# Patient Record
Sex: Female | Born: 1995 | Race: Black or African American | Hispanic: No | Marital: Single | Smoking: Never smoker
Health system: Southern US, Community
[De-identification: ages and names within clinical notes are randomized; demographics above are authoritative.]

---
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
## 2018-02-18 ENCOUNTER — Encounter: Payer: Self-pay | Admitting: Emergency Medicine

## 2018-02-18 ENCOUNTER — Emergency Department (HOSPITAL_BASED_OUTPATIENT_CLINIC_OR_DEPARTMENT_OTHER)
Admission: EM | Admit: 2018-02-18 | Discharge: 2018-02-18 | Disposition: A | Discharge status: Home / Self Care | Payer: Worker's Compensation | Attending: Emergency Medicine | Admitting: Emergency Medicine

## 2018-02-18 DIAGNOSIS — S61412A Laceration without foreign body of left hand, initial encounter: Secondary | ICD-10-CM | POA: Diagnosis not present

## 2018-02-18 DIAGNOSIS — W25XXXA Contact with sharp glass, initial encounter: Secondary | ICD-10-CM | POA: Insufficient documentation

## 2018-02-18 DIAGNOSIS — S6992XA Unspecified injury of left wrist, hand and finger(s), initial encounter: Secondary | ICD-10-CM | POA: Diagnosis present

## 2018-02-18 DIAGNOSIS — Y990 Civilian activity done for income or pay: Secondary | ICD-10-CM | POA: Diagnosis not present

## 2018-02-18 DIAGNOSIS — Y9289 Other specified places as the place of occurrence of the external cause: Secondary | ICD-10-CM | POA: Insufficient documentation

## 2018-02-18 DIAGNOSIS — Y9389 Activity, other specified: Secondary | ICD-10-CM | POA: Diagnosis not present

## 2018-02-18 MED ORDER — LIDOCAINE-EPINEPHRINE-TETRACAINE (LET) SOLUTION
NASAL | Status: AC
  Administered 2018-02-18: 16:00:00 3 mL via TOPICAL
  Filled 2018-02-18: qty 3

## 2018-02-18 MED ORDER — LIDOCAINE-EPINEPHRINE-TETRACAINE (LET) SOLUTION
3.0000 mL | Freq: Once | NASAL | Status: AC
  Administered 2018-02-18: 3 mL via TOPICAL
  Filled 2018-02-18: qty 3

## 2018-02-18 NOTE — ED Provider Notes (Signed)
[facility] EMERGENCY DEPARTMENT Provider Note   CSN: [id number] Arrival date & time: 02/18/18  1519     History   Chief Complaint Chief Complaint  Patient presents with  . Laceration    HPI Judith Moreno is a 22 y.o. female.  Pt is a healthy 22 y/o female with no medical problems who was moving glasses today at work and she was trying to catch one that was falling and hit broke and hit the top of her left hand.  She denies any weakness and states the 2nd finger of the left hand is a little tingly.  No complaints with the thumb.  There was no glass in the wound or that had to be removed from the wound.  The history is provided by the patient.  Laceration   The incident occurred 3 to 5 hours ago. The laceration is located on the left hand. The laceration is 2 cm in size. The laceration mechanism was a broken glass. The pain is at a severity of 4/10. The pain is moderate. The pain has been constant since onset. She reports no foreign bodies present. Her tetanus status is UTD.    History reviewed. No pertinent past medical history.  There are no active problems to display for this patient.   History reviewed. No pertinent surgical history.   OB History   None      Home Medications    Prior to Admission medications   Not on File    Family History No family history on file.  Social History Social History   Tobacco Use  . Smoking status: Never Smoker  . Smokeless tobacco: Never Used  Substance Use Topics  . Alcohol use: Yes  . Drug use: Not Currently     Allergies   Patient has no known allergies.   Review of Systems Review of Systems  All other systems reviewed and are negative.    Physical Exam Updated Vital Signs BP (!) 141/75 (BP Location: Left Arm)   Pulse (!) 108   Temp 98 F (36.7 C) (Oral)   Resp 20   LMP 02/08/2018   SpO2 99%   Physical Exam  Constitutional: She is oriented to person, place, and time. She appears  well-developed and well-nourished. No distress.  HENT:  Head: Normocephalic and atraumatic.  Eyes: Pupils are equal, round, and reactive to light. EOM are normal.  Cardiovascular: Normal rate.  Pulmonary/Chest: Effort normal.  Musculoskeletal:       Hands: Neurological: She is alert and oriented to person, place, and time.  Skin: Skin is warm and dry.  Psychiatric: She has a normal mood and affect. Her behavior is normal.  Nursing note and vitals reviewed.    ED Treatments / Results  Labs (all labs ordered are listed, but only abnormal results are displayed) Labs Reviewed - No data to display  EKG None  Radiology No results found.  Procedures Procedures (including critical care time)  LACERATION REPAIR Performed by: [employer] Wilhelmine Krogstad Authorized by: Gwyneth SproutWhitney Patrik Turnbaugh Consent: Verbal consent obtained. Risks and benefits: risks, benefits and alternatives were discussed Consent given by: patient Patient identity confirmed: provided demographic data Prepped and Draped in normal sterile fashion Wound explored  Laceration Location: left 2nd MCP joint  Laceration Length: 2cm  No Foreign Bodies seen or palpated  Anesthesia: LET Anesthetic total: 2 ml  Irrigation method: syringe Amount of cleaning: standard  Skin closure: 6.0 Vicryl  Number of sutures: 5  Technique: Simple interrupted  Patient tolerance:  Patient tolerated the procedure well with no immediate complications.   Medications Ordered in ED Medications  lidocaine-EPINEPHrine-tetracaine (LET) solution (has no administration in time range)     Initial Impression / Assessment and Plan / ED Course  I have reviewed the triage vital signs and the nursing notes.  Pertinent labs & imaging results that were available during my care of the patient were reviewed by me and considered in my medical decision making (see chart for details).     Pt with uncomplicated laceration of the left hand who is  Neurologically intact and no signs of tendon injury.  Wound repair as above and tetanus UTD.  Final Clinical Impressions(s) / ED Diagnoses   Final diagnoses:  Laceration of left hand without foreign body, initial encounter    ED Discharge Orders    None       Gwyneth Sprout, MD 02/18/18 [phone]

## 2018-02-18 NOTE — ED Triage Notes (Signed)
Pt states at work and cut the top of her lt hand with a broken glass

## 2018-07-27 ENCOUNTER — Encounter (HOSPITAL_BASED_OUTPATIENT_CLINIC_OR_DEPARTMENT_OTHER): Payer: Self-pay

## 2018-07-27 ENCOUNTER — Other Ambulatory Visit: Payer: Self-pay

## 2018-07-27 DIAGNOSIS — L245 Irritant contact dermatitis due to other chemical products: Secondary | ICD-10-CM | POA: Insufficient documentation

## 2018-07-27 DIAGNOSIS — J069 Acute upper respiratory infection, unspecified: Secondary | ICD-10-CM | POA: Insufficient documentation

## 2018-07-27 NOTE — ED Triage Notes (Signed)
Pt had extensions put in her hair a week ago, started having itching, burning and bumps to scalp on Friday that have gotten worse since then, pt has tried washing out her hair with shampoo and baby oil and it is starting to fall out

## 2018-07-28 ENCOUNTER — Emergency Department (HOSPITAL_BASED_OUTPATIENT_CLINIC_OR_DEPARTMENT_OTHER)
Admission: EM | Admit: 2018-07-28 | Discharge: 2018-07-28 | Disposition: A | Discharge status: Home / Self Care | Payer: Self-pay | Attending: Emergency Medicine | Admitting: Emergency Medicine

## 2018-07-28 DIAGNOSIS — L245 Irritant contact dermatitis due to other chemical products: Secondary | ICD-10-CM

## 2018-07-28 DIAGNOSIS — J069 Acute upper respiratory infection, unspecified: Secondary | ICD-10-CM

## 2018-07-28 MED ORDER — TRAMADOL HCL 50 MG PO TABS: 50.0000 mg | ORAL_TABLET | Freq: Four times a day (QID) | ORAL | 0 refills | Status: DC | PRN

## 2018-07-28 MED ORDER — PREDNISONE 50 MG PO TABS
60.0000 mg | ORAL_TABLET | Freq: Once | ORAL | Status: AC
  Administered 2018-07-28: 60 mg via ORAL
  Filled 2018-07-28: qty 1

## 2018-07-28 MED ORDER — TRAMADOL HCL 50 MG PO TABS
50.0000 mg | ORAL_TABLET | Freq: Once | ORAL | Status: AC
  Administered 2018-07-28: 50 mg via ORAL
  Filled 2018-07-28: qty 1

## 2018-07-28 MED ORDER — PREDNISONE 20 MG PO TABS: 60.0000 mg | ORAL_TABLET | Freq: Every day | ORAL | 0 refills | Status: DC

## 2018-07-28 NOTE — ED Notes (Signed)
Pt verbalizes understanding of d/c instructions and denies any further needs at this time. 

## 2018-07-28 NOTE — Discharge Instructions (Addendum)
Take acetaminophen, ibuprofen, or naproxen as needed for pain. Take tramadol for pain not relieved by the other medications.  Use Afrin nasal spray for your stuffy nose, but do not use it for more than three days, or it will make the congestion worse.  You may take any cold medication with DM in the name (contains dextromethorphan) as needed for your cough.  Do not smoke!

## 2018-07-28 NOTE — ED Provider Notes (Signed)
[facility] EMERGENCY DEPARTMENT Provider Note   CSN: [id number] Arrival date & time: 07/27/18  2337     History   Chief Complaint Chief Complaint  Patient presents with  . Hair/Scalp Problem    HPI Judith Moreno is a 22 y.o. female.  The history is provided by the patient.  She is complaining of a burning pain in her scalp.  One week ago, she had extensions put in her hair.  Scalp started burning 2 days later and she removed the extensions.  She has noted that her hair is falling out in some places.  There is no itching, but scalp has burning pain.  Of note, another person had extensions put in their hair at the same location and also developed similar problems.  As a second complaint, she has cold symptoms for the last 24 hours.  She has clear rhinorrhea and a slight cough which is nonproductive.  She denies sore throat.  She denies fever or chills.  She denies arthralgias or myalgias.  There have been no known sick contacts.  She does smoke occasionally.  History reviewed. No pertinent past medical history.  There are no active problems to display for this patient.   History reviewed. No pertinent surgical history.   OB History   None      Home Medications    Prior to Admission medications   Medication Sig Start Date End Date Taking? Authorizing Provider  predniSONE (DELTASONE) 20 MG tablet Take 3 tablets (60 mg total) by mouth daily. 07/28/18   Dione Booze, MD  traMADol (ULTRAM) 50 MG tablet Take 1 tablet (50 mg total) by mouth every 6 (six) hours as needed. 07/28/18   Dione Booze, MD    Family History No family history on file.  Social History Social History   Tobacco Use  . Smoking status: Never Smoker  . Smokeless tobacco: Never Used  Substance Use Topics  . Alcohol use: Yes  . Drug use: Not Currently     Allergies   Patient has no known allergies.   Review of Systems Review of Systems  All other systems reviewed and are  negative.    Physical Exam Updated Vital Signs BP 110/62 (BP Location: Left Arm)   Pulse 95   Temp 98.6 F (37 C) (Oral)   Resp 18   Ht 5\' 6"  (1.676 m)   Wt 78.9 kg   LMP 07/16/2018   SpO2 100%   BMI 28.08 kg/m   Physical Exam  Nursing note and vitals reviewed.  22 year old female, resting comfortably and in no acute distress. Vital signs are normal. Oxygen saturation is 100%, which is normal. Head is normocephalic and atraumatic. PERRLA, EOMI. Oropharynx is clear.  Scalp has areas of erythema with some vesicles present-mainly in the occipital region.  This is consistent with contact dermatitis.  There is no sinus tenderness. Neck is nontender and supple with shoddy posterior cervical adenopathy. Back is nontender and there is no CVA tenderness. Lungs are clear without rales, wheezes, or rhonchi. Chest is nontender. Heart has regular rate and rhythm without murmur. Abdomen is soft, flat, nontender without masses or hepatosplenomegaly and peristalsis is normoactive. Extremities have no cyanosis or edema, full range of motion is present. Skin is warm and dry without rash. Neurologic: Mental status is normal, cranial nerves are intact, there are no motor or sensory deficits.  ED Treatments / Results   Procedures Procedures  Medications Ordered in ED Medications  traMADol (ULTRAM) tablet  50 mg (50 mg Oral Given 07/28/18 0317)  predniSONE (DELTASONE) tablet 60 mg (60 mg Oral Given 07/28/18 0317)     Initial Impression / Assessment and Plan / ED Course  I have reviewed the triage vital signs and the nursing notes.  Contact dermatitis of the scalp.  Upper respiratory tract infection.  She is discharged with prescriptions for prednisone and tramadol, advised to use over-the-counter metolazone for nasal congestion, over-the-counter dextromethorphan for cough.  Advised to avoid all smoking.  Return precautions discussed.  Final Clinical Impressions(s) / ED Diagnoses   Final  diagnoses:  Irritant contact dermatitis due to other chemical products  Viral URI    ED Discharge Orders         Ordered    predniSONE (DELTASONE) 20 MG tablet  Daily     07/28/18 0322    traMADol (ULTRAM) 50 MG tablet  Every 6 hours PRN     07/28/18 0322           Dione Booze, MD 07/28/18 [phone]

## 2019-12-08 ENCOUNTER — Emergency Department (HOSPITAL_BASED_OUTPATIENT_CLINIC_OR_DEPARTMENT_OTHER)
Admission: EM | Admit: 2019-12-08 | Discharge: 2019-12-08 | Disposition: A | Discharge status: Home / Self Care | Payer: Self-pay | Attending: Emergency Medicine | Admitting: Emergency Medicine

## 2019-12-08 ENCOUNTER — Other Ambulatory Visit: Payer: Self-pay

## 2019-12-08 ENCOUNTER — Encounter (HOSPITAL_BASED_OUTPATIENT_CLINIC_OR_DEPARTMENT_OTHER): Payer: Self-pay

## 2019-12-08 DIAGNOSIS — S46912A Strain of unspecified muscle, fascia and tendon at shoulder and upper arm level, left arm, initial encounter: Secondary | ICD-10-CM | POA: Diagnosis not present

## 2019-12-08 DIAGNOSIS — Y9389 Activity, other specified: Secondary | ICD-10-CM | POA: Insufficient documentation

## 2019-12-08 DIAGNOSIS — Y999 Unspecified external cause status: Secondary | ICD-10-CM | POA: Insufficient documentation

## 2019-12-08 DIAGNOSIS — S29012A Strain of muscle and tendon of back wall of thorax, initial encounter: Secondary | ICD-10-CM | POA: Insufficient documentation

## 2019-12-08 DIAGNOSIS — T148XXA Other injury of unspecified body region, initial encounter: Secondary | ICD-10-CM

## 2019-12-08 DIAGNOSIS — Y92410 Unspecified street and highway as the place of occurrence of the external cause: Secondary | ICD-10-CM | POA: Diagnosis not present

## 2019-12-08 DIAGNOSIS — S299XXA Unspecified injury of thorax, initial encounter: Secondary | ICD-10-CM | POA: Diagnosis present
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
## 2019-12-12 ENCOUNTER — Encounter (HOSPITAL_BASED_OUTPATIENT_CLINIC_OR_DEPARTMENT_OTHER): Payer: Self-pay | Admitting: Emergency Medicine

## 2019-12-12 ENCOUNTER — Other Ambulatory Visit: Payer: Self-pay

## 2019-12-12 ENCOUNTER — Emergency Department (HOSPITAL_BASED_OUTPATIENT_CLINIC_OR_DEPARTMENT_OTHER): Payer: No Typology Code available for payment source

## 2019-12-12 ENCOUNTER — Emergency Department (HOSPITAL_BASED_OUTPATIENT_CLINIC_OR_DEPARTMENT_OTHER)
Admission: EM | Admit: 2019-12-12 | Discharge: 2019-12-12 | Disposition: A | Discharge status: Home / Self Care | Payer: No Typology Code available for payment source | Attending: Emergency Medicine | Admitting: Emergency Medicine

## 2019-12-12 DIAGNOSIS — M545 Low back pain: Secondary | ICD-10-CM | POA: Insufficient documentation

## 2019-12-12 DIAGNOSIS — M25531 Pain in right wrist: Secondary | ICD-10-CM | POA: Insufficient documentation

## 2019-12-12 MED ORDER — IBUPROFEN 600 MG PO TABS: 600.0000 mg | ORAL_TABLET | Freq: Four times a day (QID) | ORAL | 0 refills | Status: DC | PRN

## 2019-12-12 MED ORDER — CYCLOBENZAPRINE HCL 10 MG PO TABS: 10.0000 mg | ORAL_TABLET | Freq: Every day | ORAL | 0 refills | Status: AC

## 2019-12-12 MED ORDER — CYCLOBENZAPRINE HCL 10 MG PO TABS
10.0000 mg | ORAL_TABLET | Freq: Once | ORAL | Status: AC
  Administered 2019-12-12: 23:00:00 10 mg via ORAL
  Filled 2019-12-12: qty 1

## 2019-12-12 MED ORDER — IBUPROFEN 400 MG PO TABS
600.0000 mg | ORAL_TABLET | Freq: Once | ORAL | Status: AC
  Administered 2019-12-12: 600 mg via ORAL
  Filled 2019-12-12: qty 1
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
## 2022-09-27 ENCOUNTER — Other Ambulatory Visit: Payer: Self-pay

## 2022-09-27 ENCOUNTER — Encounter (HOSPITAL_BASED_OUTPATIENT_CLINIC_OR_DEPARTMENT_OTHER): Payer: Self-pay

## 2022-09-27 ENCOUNTER — Emergency Department (HOSPITAL_BASED_OUTPATIENT_CLINIC_OR_DEPARTMENT_OTHER)
Admission: EM | Admit: 2022-09-27 | Discharge: 2022-09-27 | Disposition: A | Discharge status: Home / Self Care | Payer: Self-pay | Attending: Emergency Medicine | Admitting: Emergency Medicine

## 2022-09-27 ENCOUNTER — Emergency Department (HOSPITAL_BASED_OUTPATIENT_CLINIC_OR_DEPARTMENT_OTHER): Payer: Self-pay

## 2022-09-27 ENCOUNTER — Encounter (HOSPITAL_BASED_OUTPATIENT_CLINIC_OR_DEPARTMENT_OTHER): Payer: Self-pay | Admitting: Emergency Medicine

## 2022-09-27 DIAGNOSIS — R519 Headache, unspecified: Secondary | ICD-10-CM | POA: Insufficient documentation

## 2022-09-27 DIAGNOSIS — R202 Paresthesia of skin: Secondary | ICD-10-CM | POA: Insufficient documentation

## 2022-09-27 DIAGNOSIS — R110 Nausea: Secondary | ICD-10-CM | POA: Insufficient documentation

## 2022-09-27 DIAGNOSIS — F419 Anxiety disorder, unspecified: Secondary | ICD-10-CM | POA: Insufficient documentation

## 2022-09-27 DIAGNOSIS — H53149 Visual discomfort, unspecified: Secondary | ICD-10-CM | POA: Insufficient documentation

## 2022-09-27 DIAGNOSIS — R450 Nervousness: Secondary | ICD-10-CM | POA: Insufficient documentation

## 2022-09-27 LAB — CBC WITH DIFFERENTIAL/PLATELET
Abs Immature Granulocytes: 0.01 10*3/uL (ref 0.00–0.07)
Basophils Absolute: 0 10*3/uL (ref 0.0–0.1)
Basophils Relative: 1 %
Eosinophils Absolute: 0.1 10*3/uL (ref 0.0–0.5)
Eosinophils Relative: 1 %
HCT: 39.5 % (ref 36.0–46.0)
Hemoglobin: 13.4 g/dL (ref 12.0–15.0)
Immature Granulocytes: 0 %
Lymphocytes Relative: 59 %
Lymphs Abs: 4.1 10*3/uL — ABNORMAL HIGH (ref 0.7–4.0)
MCH: 30.5 pg (ref 26.0–34.0)
MCHC: 33.9 g/dL (ref 30.0–36.0)
MCV: 90 fL (ref 80.0–100.0)
Monocytes Absolute: 0.6 10*3/uL (ref 0.1–1.0)
Monocytes Relative: 9 %
Neutro Abs: 2 10*3/uL (ref 1.7–7.7)
Neutrophils Relative %: 30 %
Platelets: 268 10*3/uL (ref 150–400)
RBC: 4.39 MIL/uL (ref 3.87–5.11)
RDW: 13 % (ref 11.5–15.5)
WBC: 6.8 10*3/uL (ref 4.0–10.5)
nRBC: 0 % (ref 0.0–0.2)

## 2022-09-27 LAB — BASIC METABOLIC PANEL
Anion gap: 10 (ref 5–15)
BUN: 13 mg/dL (ref 6–20)
CO2: 26 mmol/L (ref 22–32)
Calcium: 9.6 mg/dL (ref 8.9–10.3)
Chloride: 105 mmol/L (ref 98–111)
Creatinine, Ser: 0.72 mg/dL (ref 0.44–1.00)
GFR, Estimated: >60 mL/min (ref >60)
Glucose, Bld: 120 mg/dL — ABNORMAL HIGH (ref 70–99)
Potassium: 3.5 mmol/L (ref 3.5–5.1)
Sodium: 141 mmol/L (ref 135–145)

## 2022-09-27 MED ORDER — KETOROLAC TROMETHAMINE 30 MG/ML IJ SOLN
30.0000 mg | Freq: Once | INTRAMUSCULAR | Status: AC
  Administered 2022-09-27: 30 mg via INTRAVENOUS
  Filled 2022-09-27: qty 1

## 2022-09-27 MED ORDER — SODIUM CHLORIDE 0.9 % IV BOLUS
1000.0000 mL | Freq: Once | INTRAVENOUS | Status: AC
  Administered 2022-09-27: 1000 mL via INTRAVENOUS

## 2022-09-27 MED ORDER — PROCHLORPERAZINE EDISYLATE 10 MG/2ML IJ SOLN
10.0000 mg | Freq: Once | INTRAMUSCULAR | Status: AC
  Administered 2022-09-27: 10 mg via INTRAVENOUS
  Filled 2022-09-27: qty 2

## 2022-09-27 MED ORDER — DIPHENHYDRAMINE HCL 25 MG PO CAPS
25.0000 mg | ORAL_CAPSULE | Freq: Once | ORAL | Status: AC
  Administered 2022-09-27: 25 mg via ORAL
  Filled 2022-09-27: qty 1
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
## 2022-09-27 NOTE — ED Notes (Signed)
Pt fluids slowed down to 511ml/hr due to stating "the fluids are hurting". PIV assessed, no swelling noted to surrounding area, no redness. PIV infusing. Pt offered warm compress or ice, pt states she is okay for now.

## 2022-09-27 NOTE — ED Triage Notes (Signed)
Pt states that she has had headaches on and off x 3 weeks. The headaches are sometimes accompanied by SOB. Denies SOB tonight

## 2022-09-27 NOTE — ED Provider Notes (Signed)
[place] EMERGENCY DEPARTMENT Provider Note   CSN: [id number] Arrival date & time: 09/27/22  0750     History  Chief Complaint  Patient presents with   Dizziness   leg jittery    Judith Moreno is a 26 y.o. female.  Patient here with lightheadedness, jittery feeling after getting medicine in the emergency department last night for headache.  She states after she got the IV medicine she felt jittery and a little bit anxious.  She went home about 2 to 3 hours ago but still feels like she cannot relax.  Headache has improved.  She states she had blood work and a head CT.  She denies any weakness or numbness or speech changes or vision changes.  Nothing has made it worse or better.  The history is provided by the patient.       Home Medications Prior to Admission medications   Medication Sig Start Date End Date Taking? Authorizing Provider  ibuprofen (ADVIL) 600 MG tablet Take 1 tablet (600 mg total) by mouth every 6 (six) hours as needed. 12/12/19   Wyvonnia Dusky, MD  predniSONE (DELTASONE) 20 MG tablet Take 3 tablets (60 mg total) by mouth daily. 123456   Delora Fuel, MD  traMADol (ULTRAM) 50 MG tablet Take 1 tablet (50 mg total) by mouth every 6 (six) hours as needed. 123456   Delora Fuel, MD      Allergies    Compazine [prochlorperazine]    Review of Systems   Review of Systems  Physical Exam Updated Vital Signs BP 118/66 (BP Location: Right Arm)   Pulse (!) 108   Temp 98.1 F (36.7 C) (Oral)   Resp 17   LMP 09/22/2022 (Exact Date) Comment: per pt  SpO2 98%  Physical Exam Vitals and nursing note reviewed.  Constitutional:      General: She is not in acute distress.    Appearance: She is well-developed. She is not ill-appearing.  HENT:     Head: Normocephalic and atraumatic.     Mouth/Throat:     Mouth: Mucous membranes are moist.  Eyes:     Extraocular Movements: Extraocular movements intact.     Conjunctiva/sclera: Conjunctivae normal.      Pupils: Pupils are equal, round, and reactive to light.  Cardiovascular:     Rate and Rhythm: Normal rate and regular rhythm.     Pulses: Normal pulses.     Heart sounds: Normal heart sounds. No murmur heard. Pulmonary:     Effort: Pulmonary effort is normal. No respiratory distress.     Breath sounds: Normal breath sounds.  Abdominal:     Palpations: Abdomen is soft.     Tenderness: There is no abdominal tenderness.  Musculoskeletal:        General: No swelling.     Cervical back: Normal range of motion and neck supple.  Skin:    General: Skin is warm and dry.     Capillary Refill: Capillary refill takes less than 2 seconds.  Neurological:     General: No focal deficit present.     Mental Status: She is alert and oriented to person, place, and time.     Cranial Nerves: No cranial nerve deficit.     Sensory: No sensory deficit.     Motor: No weakness.     Coordination: Coordination normal.  Psychiatric:        Mood and Affect: Mood normal.     ED Results / Procedures / Treatments  Labs (all labs ordered are listed, but only abnormal results are displayed) Labs Reviewed - No data to display  EKG None  Radiology CT Head Wo Contrast  Result Date: 09/27/2022 CLINICAL DATA:  Intermittent headaches. EXAM: CT HEAD WITHOUT CONTRAST TECHNIQUE: Contiguous axial images were obtained from the base of the skull through the vertex without intravenous contrast. RADIATION DOSE REDUCTION: This exam was performed according to the departmental dose-optimization program which includes automated exposure control, adjustment of the mA and/or kV according to patient size and/or use of iterative reconstruction technique. COMPARISON:  None Available. FINDINGS: Brain: No evidence of acute infarction, hemorrhage, hydrocephalus, extra-axial collection or mass lesion/mass effect. Vascular: No hyperdense vessel or unexpected calcification. Skull: Normal. Negative for fracture or focal lesion.  Sinuses/Orbits: No acute finding. Other: None. IMPRESSION: No acute intracranial pathology. Electronically Signed   By: Aram Candela M.D.   On: 09/27/2022 03:11    Procedures Procedures    Medications Ordered in ED Medications  diphenhydrAMINE (BENADRYL) capsule 25 mg (has no administration in time range)    ED Course/ Medical Decision Making/ A&P                           Medical Decision Making  Judith Moreno is here with lightheadedness, jittery feeling.  Normal vitals.  No fever.  Patient per my chart review was here last night for headache.  She had CBC, BMP and head CT that were unremarkable.  She was given Compazine and Toradol but does not appear that she got Benadryl.  She states after she got the IV medicine she has felt a little bit anxious and jittery.  She went home and she still feeling that way.  Overall I suspect that this is a dystonic reaction from her Compazine that she was given.  Headache has improved but she states that she is having a hard time feeling comfortable sleeping.  Neurologically she is intact.  She appears well.  Overall discussed with her about how this is likely a mild side effect of Compazine.  We will give her Benadryl and have her continue Benadryl at home as needed but suspect that this should wear off by the end of the day.  Patient discharged in good condition.  Understands return precautions.  This chart was dictated using voice recognition software.  Despite best efforts to proofread,  errors can occur which can change the documentation meaning.         Final Clinical Impression(s) / ED Diagnoses Final diagnoses:  Jittery feeling    Rx / DC Orders ED Discharge Orders     None         Virgina Norfolk, DO 09/27/22 0940

## 2022-09-27 NOTE — Discharge Instructions (Signed)
Overall suspect that you are having a mild side effect from the Compazine which can cause jittery feeling and anxiousness and restlessness.  I have given you Benadryl to help with this.  Recommend 25 mg of Benadryl every 6 hours as needed for this but this should improve over the course of the day and hopefully be resolved by later tonight or tomorrow morning.  Please try to get some rest and stay hydrated.

## 2022-09-27 NOTE — ED Provider Notes (Signed)
[facility] EMERGENCY DEPARTMENT  Provider Note  CSN: [id number] Arrival date & time: 09/27/22 0215  History Chief Complaint  Patient presents with   Headache    Judith Moreno is a 25 y.o. female with no significant PMH reports persistent headache for the last 2-3 weeks, sometimes on the right side and sometimes on the left. Headache sometimes resolves but returns an hour later. Associated with photophobia and nausea. No scotoma or rhinorrhea. No history of similar headaches. Has tried APAP and BC powder without improvement. She denies any recent URI symptoms, fevers. No arm or leg numbness/weakness. She sometimes has some tingling in her neck and gets anxious/hyperventilates.    Home Medications Prior to Admission medications   Medication Sig Start Date End Date Taking? Authorizing Provider  ibuprofen (ADVIL) 600 MG tablet Take 1 tablet (600 mg total) by mouth every 6 (six) hours as needed. 12/12/19   Terald Sleeper, MD  predniSONE (DELTASONE) 20 MG tablet Take 3 tablets (60 mg total) by mouth daily. 07/28/18   Dione Booze, MD  traMADol (ULTRAM) 50 MG tablet Take 1 tablet (50 mg total) by mouth every 6 (six) hours as needed. 07/28/18   Dione Booze, MD     Allergies    Patient has no known allergies.   Review of Systems   Review of Systems Please see HPI for pertinent positives and negatives  Physical Exam BP (!) 140/76 (BP Location: Right Arm)   Pulse (!) 130   Temp 98.5 F (36.9 C) (Oral)   Resp 18   Ht 5\' 5"  (1.651 m)   Wt 81.6 kg   LMP 09/22/2022 (Exact Date) Comment: per pt  SpO2 99%   BMI 29.95 kg/m   Physical Exam Vitals and nursing note reviewed.  Constitutional:      Appearance: Normal appearance.  HENT:     Head: Normocephalic and atraumatic.     Nose: Nose normal.     Mouth/Throat:     Mouth: Mucous membranes are moist.  Eyes:     Extraocular Movements: Extraocular movements intact.     Conjunctiva/sclera: Conjunctivae normal.   Cardiovascular:     Rate and Rhythm: Normal rate.  Pulmonary:     Effort: Pulmonary effort is normal.     Breath sounds: Normal breath sounds.  Abdominal:     General: Abdomen is flat.     Palpations: Abdomen is soft.     Tenderness: There is no abdominal tenderness.  Musculoskeletal:        General: No swelling. Normal range of motion.     Cervical back: Neck supple.  Skin:    General: Skin is warm and dry.  Neurological:     General: No focal deficit present.     Mental Status: She is alert and oriented to person, place, and time.     Cranial Nerves: No cranial nerve deficit.     Sensory: No sensory deficit.     Motor: No weakness.     Gait: Gait normal.  Psychiatric:        Mood and Affect: Mood normal.     ED Results / Procedures / Treatments   EKG None  Procedures Procedures  Medications Ordered in the ED Medications  ketorolac (TORADOL) 30 MG/ML injection 30 mg (30 mg Intravenous Given 09/27/22 0255)  prochlorperazine (COMPAZINE) injection 10 mg (10 mg Intravenous Given 09/27/22 0254)  sodium chloride 0.9 % bolus 1,000 mL (1,000 mLs Intravenous New Bag/Given 09/27/22 0314)    Initial  Impression and Plan  Patient here with migrainous headache. Given duration, consider cluster headaches. Doubt this is a life threatening cause but given duration and no prior history, will check basic labs and send for CT. Patient noted to be tachycardic during triage but she admits she was nervous about IV placement and on my arrival to room, HR was 95, Plan headache cocktail for symptom relief.   ED Course   Clinical Course as of 09/27/22 0348  Sat Sep 27, 2022  0309 CBC is normal.  [CS]  [phone] I personally viewed the images from radiology studies and agree with radiologist interpretation: CT is normal.   [CS]  0319 BMP is unremarkable.  [CS]  [phone] Patient reports headache has improved. She is feeling a little lightheaded, likely from the compazine. Will continue to monitor  while IVF are infusing. Plan discharge with outpatient Neuro follow up if headache continues [CS]    Clinical Course User Index [CS] Pollyann Savoy, MD     MDM Rules/Calculators/A&P Medical Decision Making Problems Addressed: Acute nonintractable headache, unspecified headache type: acute illness or injury  Amount and/or Complexity of Data Reviewed Labs: ordered. Decision-making details documented in ED Course. Radiology: ordered and independent interpretation performed. Decision-making details documented in ED Course.  Risk Prescription drug management.    Final Clinical Impression(s) / ED Diagnoses Final diagnoses:  Acute nonintractable headache, unspecified headache type    Rx / DC Orders ED Discharge Orders     None        Pollyann Savoy, MD 09/27/22 [phone]

## 2022-09-30 DIAGNOSIS — R519 Headache, unspecified: Secondary | ICD-10-CM | POA: Insufficient documentation

## 2022-09-30 DIAGNOSIS — Z5321 Procedure and treatment not carried out due to patient leaving prior to being seen by health care provider: Secondary | ICD-10-CM | POA: Insufficient documentation

## 2022-09-30 DIAGNOSIS — R200 Anesthesia of skin: Secondary | ICD-10-CM | POA: Insufficient documentation

## 2022-10-01 ENCOUNTER — Encounter (HOSPITAL_COMMUNITY): Payer: Self-pay

## 2022-10-01 ENCOUNTER — Other Ambulatory Visit: Payer: Self-pay

## 2022-10-01 ENCOUNTER — Emergency Department (HOSPITAL_COMMUNITY)
Admission: EM | Admit: 2022-10-01 | Discharge: 2022-10-01 | Discharge status: Against Medical Advice | Payer: Medicaid Other | Attending: Emergency Medicine | Admitting: Emergency Medicine

## 2022-10-01 NOTE — ED Provider Triage Note (Signed)
Emergency Medicine Provider Triage Evaluation Note  Judith Moreno , a 26 y.o. female  was evaluated in triage.  Pt complains of right-sided cheek numbness.  Patient noted right-sided cheek numbness about 4 hours ago.  Has now resolved.  Had an associated headache.  Was seen here on the 11th and had a CT scan that done which did not show any signs of acute stroke.  She states that she occasionally will get numbness in other parts of her body.  She states that she wants to know if the numbness in her cheek could be due to her headaches.  She called her PCP who recommended she come to the ER.  She denies any vision changes, nausea or vomiting.  She has not taken anything for her headache.  She no longer has a headache.  Denies any fevers or neck stiffness.  Review of Systems  Positive: As above Negative: As above  Physical Exam  BP (!) 162/78 (BP Location: Right Arm)   Pulse (!) 136   Temp 98.4 F (36.9 C) (Oral)   Resp 18   LMP 09/22/2022 (Exact Date) Comment: per pt  SpO2 99%  Gen:   Awake, no distress   Resp:  Normal effort  MSK:   Moves extremities without difficulty  Other:  Mental Status:  Alert, thought content appropriate, able to give a coherent history. Speech fluent without evidence of aphasia. Able to follow 2 step commands without difficulty.  Cranial Nerves:  II:  Peripheral visual fields grossly normal, pupils equal, round, reactive to light III,IV, VI: ptosis not present, extra-ocular motions intact bilaterally  V,VII: smile symmetric, facial light touch sensation equal VIII: hearing grossly normal to voice  X: uvula elevates symmetrically  XI: bilateral shoulder shrug symmetric and strong XII: midline tongue extension without fassiculations Motor:  Normal tone. 5/5 strength of BUE and BLE major muscle groups including strong and equal grip strength and dorsiflexion/plantar flexion Sensory: light touch normal in all extremities. Cerebellar: normal finger-to-nose with  bilateral upper extremities, Romberg sign absent Gait: normal gait and balance. Able to walk on toes and heels with ease.    Medical Decision Making  Medically screening exam initiated at 12:36 AM.  Appropriate orders placed.  Isha Seefeld was informed that the remainder of the evaluation will be completed by another provider, this initial triage assessment does not replace that evaluation, and the importance of remaining in the ED until their evaluation is complete.     Mare Ferrari, PA-C 10/01/22 [phone]

## 2022-10-01 NOTE — ED Notes (Signed)
No longer to be found in the lobby....Marland Kitchensort calls for vs and no response

## 2022-10-01 NOTE — ED Triage Notes (Signed)
Reports right cheek numbness that lasted ~3 hours.   Scheduled to see a neurologist for chronic migraines.   Recent CT completed.

## 2022-10-06 NOTE — Progress Notes (Unsigned)
Referring:  No referring provider defined for this encounter.  PCP: Patient, No Pcp Per  Neurology was asked to evaluate Laneta Guerin, a 26 year old female for a chief complaint of headaches.  Our recommendations of care will be communicated by shared medical record.    CC:  headaches, numbness  History provided from self  HPI:  Medical co-morbidities: anxiety  The patient presents for evaluation of headaches and face tingling which began in October 2023. This initially lasted for ~1 week, but then it returned in November and has persisted since then. She currently has headaches daily. They are described as severe throbbing pain with associated nausea. She denies phonophobia or photophobia. Continues to have intermittent paresthesias in her right face. Denies vision changes or unilateral weakness.  She presented to the ED 09/27/22 where [facility] was unremarkable. She was prescribed compazine but this caused severe restlessness. Also had a Toradol shot in the ED which did resolve her headache temporarily.  States she has always had bad headaches, but they were never this persistent. She does wonder if anxiety may be contributing as she has noticed increased anxiety recently. She also stopped smoking THC around the time the paresthesias started.  Headache History: Onset: October 2023 Aura: right face tingling Location: varies Quality/Description: throbbing Associated Symptoms:  Photophobia: no  Phonophobia: no  Nausea: yes Worse with activity?: yes Duration of headaches: hours  Headache days per month: 30 Headache free days per month: 0  Current Treatment: Abortive none  Preventative none  Prior Therapies                                 Tylenol - lack of efficacy Compazine - restlessness   LABS: CBC    Component Value Date/Time   WBC 6.8 09/27/2022 0242   RBC 4.39 09/27/2022 0242   HGB 13.4 09/27/2022 0242   HCT 39.5 09/27/2022 0242   PLT 268 09/27/2022 0242    MCV 90.0 09/27/2022 0242   MCH 30.5 09/27/2022 0242   MCHC 33.9 09/27/2022 0242   RDW 13.0 09/27/2022 0242   LYMPHSABS 4.1 (H) 09/27/2022 0242   MONOABS 0.6 09/27/2022 0242   EOSABS 0.1 09/27/2022 0242   BASOSABS 0.0 09/27/2022 0242      Latest Ref Rng & Units 09/27/2022    2:42 AM  CMP  Glucose 70 - 99 mg/dL 395   BUN 6 - 20 mg/dL 13   Creatinine 3.20 - 1.00 mg/dL 2.33   Sodium 435 - 686 mmol/L 141   Potassium 3.5 - 5.1 mmol/L 3.5   Chloride 98 - 111 mmol/L 105   CO2 22 - 32 mmol/L 26   Calcium 8.9 - 10.3 mg/dL 9.6      IMAGING:  [facility] 09/27/22: unremarkable  Imaging independently reviewed on October 07, 2022   No current outpatient medications on file prior to visit.   No current facility-administered medications on file prior to visit.     Allergies: Allergies  Allergen Reactions   Compazine [Prochlorperazine]     Jittery, anxious    Family History: Migraine or other headaches in the family:  no Aneurysms in a first degree relative:  no Brain tumors in the family:  no Other neurological illness in the family:   no  Past Medical History: History reviewed. No pertinent past medical history.  Past Surgical History History reviewed. No pertinent surgical history.  Social History: Social History   Tobacco  Use   Smoking status: Never   Smokeless tobacco: Never  Vaping Use   Vaping Use: Never used  Substance Use Topics   Alcohol use: Not Currently   Drug use: Not Currently    ROS: Negative for fevers, chills. Positive for headaches, facial paresthesias. All other systems reviewed and negative unless stated otherwise in HPI.   Physical Exam:   Vital Signs: BP 131/77   Pulse (!) 130   Ht 5\' 5"  (1.651 m)   Wt 176 lb (79.8 kg)   LMP 09/22/2022 (Exact Date) Comment: per pt  BMI 29.29 kg/m  GENERAL: well appearing,in no acute distress,alert SKIN:  Color, texture, turgor normal. No rashes or lesions HEAD:  Normocephalic/atraumatic. CV:   RRR RESP: Normal respiratory effort MSK: no tenderness to palpation over occiput, neck, or shoulders  NEUROLOGICAL: Mental Status: Alert, oriented to person, place and time,Follows commands Cranial Nerves: PERRL, visual fields intact to confrontation, extraocular movements intact, facial sensation intact, no facial droop or ptosis, hearing grossly intact, no dysarthria Motor: muscle strength 5/5 both upper and lower extremities,no drift, normal tone Reflexes: 2+ throughout Sensation: intact to light touch all 4 extremities Coordination: Finger-to- nose-finger intact bilaterally Gait: normal-based   IMPRESSION: 26 year old female with a history of anxiety who presents for evaluation of new daily headaches and facial paresthesias. Will order brain MRI to assess for underlying structural causes of her sensation changes. If imaging is normal, suspect her symptoms are secondary to migraines as she does have throbbing headaches associated with her paresthesias. Will prescribe 5 day course of Toradol to help break her current headache cycle. If headaches persist, she may benefit from a preventive medication such as propranolol or Cymbalta, which can help with both anxiety and migraine.  PLAN: -MRI brain -Toradol 10 mg TID x 5 days to break current headache cycle -Next steps: consider propranolol or Cymbalta for migraine prevention. Consider PRN triptan for rescue   I spent a total of 28 minutes chart reviewing and counseling the patient. Headache education was done. Discussed treatment options including acute medications. Discussed medication side effects, adverse reactions and drug interactions. Written educational materials and patient instructions outlining all of the above were given.  Follow-up: 4 months   30, MD 10/07/2022   4:09 PM

## 2022-10-07 ENCOUNTER — Ambulatory Visit (INDEPENDENT_AMBULATORY_CARE_PROVIDER_SITE_OTHER): Payer: Self-pay | Admitting: Psychiatry

## 2022-10-07 ENCOUNTER — Encounter: Payer: Self-pay | Admitting: Psychiatry

## 2022-10-07 VITALS — BP 131/77 | HR 130 | Ht 65.0 in | Wt 176.0 lb

## 2022-10-07 DIAGNOSIS — R29818 Other symptoms and signs involving the nervous system: Secondary | ICD-10-CM

## 2022-10-07 DIAGNOSIS — R519 Headache, unspecified: Secondary | ICD-10-CM

## 2022-10-07 DIAGNOSIS — G43101 Migraine with aura, not intractable, with status migrainosus: Secondary | ICD-10-CM

## 2022-10-07 MED ORDER — KETOROLAC TROMETHAMINE 10 MG PO TABS: 10.0000 mg | ORAL_TABLET | Freq: Three times a day (TID) | ORAL | 0 refills | Status: AC

## 2022-10-07 MED ORDER — LORAZEPAM 0.5 MG PO TABS: ORAL_TABLET | ORAL | 0 refills | Status: AC

## 2022-10-08 ENCOUNTER — Telehealth: Payer: Self-pay | Admitting: Psychiatry

## 2022-10-08 NOTE — Telephone Encounter (Signed)
self-pay sent to [facility] for open MRI [phone]

## 2022-10-24 ENCOUNTER — Encounter: Payer: Self-pay | Admitting: Psychiatry

## 2022-10-27 ENCOUNTER — Telehealth: Payer: Self-pay | Admitting: Psychiatry

## 2022-10-27 NOTE — Telephone Encounter (Signed)
Addressed in TE 10/27/22

## 2022-10-27 NOTE — Telephone Encounter (Signed)
Pt is asking for a call to discuss the results of the MRI

## 2022-10-27 NOTE — Telephone Encounter (Signed)
MRI head placed on MD desk for review   Called pt back and informed her we did receive results Friday but we were not open, sending results to MD now

## 2022-10-28 NOTE — Telephone Encounter (Signed)
Called the pt and made her aware of the brain results. She states that she is still having headaches and wanted to know what next steps are advised that we would just continue to treat the symptoms. Advised per the last ov Dr Delena Bali mentioned considering a medication to help as a preventative, such as propranolol. Advised the patient side effects of medications like this. Questioned how her BP runs and she states it runes 120's/80's. Advised I would send her a message on what Dr Delena Bali recommends in regards to treatment.  Confirmed the pharmacy on file is correct.  "Brain MRI is normal and does not show any cause for headaches or sensation changes"

## 2022-10-28 NOTE — Telephone Encounter (Signed)
Pt is calling. Requesting a call back about her MRI. Stated she have some question.

## 2022-10-29 ENCOUNTER — Encounter: Payer: Self-pay | Admitting: Neurology

## 2022-10-29 MED ORDER — PROPRANOLOL HCL 20 MG PO TABS: 20.0000 mg | ORAL_TABLET | Freq: Two times a day (BID) | ORAL | 6 refills | Status: AC

## 2022-10-29 NOTE — Telephone Encounter (Signed)
Pt notified through mychart.

## 2022-10-29 NOTE — Addendum Note (Signed)
Addended by: Ocie Doyne on: 10/29/2022 08:55 AM   Modules accepted: Orders

## 2022-10-29 NOTE — Telephone Encounter (Signed)
I sent an rx for propranolol 20 mg BID to her pharmacy. She can start taking this daily for headache prevention
